# Patient Record
Sex: Male | Born: 1960 | Race: White | Hispanic: No
Health system: Southern US, Community
[De-identification: ages and names within clinical notes are randomized; demographics above are authoritative.]

## PROBLEM LIST (undated history)

## (undated) DIAGNOSIS — G4730 Sleep apnea, unspecified: Secondary | ICD-10-CM

## (undated) DIAGNOSIS — E785 Hyperlipidemia, unspecified: Secondary | ICD-10-CM

## (undated) DIAGNOSIS — I10 Essential (primary) hypertension: Secondary | ICD-10-CM

## (undated) DIAGNOSIS — Z9109 Other allergy status, other than to drugs and biological substances: Secondary | ICD-10-CM

---

## 2005-01-07 ENCOUNTER — Observation Stay: Payer: Self-pay | Admitting: Internal Medicine

## 2006-05-12 ENCOUNTER — Ambulatory Visit: Payer: Self-pay | Admitting: Internal Medicine

## 2007-10-17 IMAGING — CR DG CHEST 1V PORT
1 series · 1 of 1 positions shown · non-contrast
Comparison: none

REASON FOR EXAM: Difficulty breathing.  [HOSPITAL]
COMMENTS:  LMP: (Male)

PROCEDURE:     DXR - DXR PORTABLE CHEST SINGLE VIEW  - January 06, 2005 [DATE]
RESULT:     AP view of the chest shows the lung fields to be clear.  No
pneumonia, pneumothorax, or pleural effusion is seen.  The heart size is
normal.

[view not recorded]
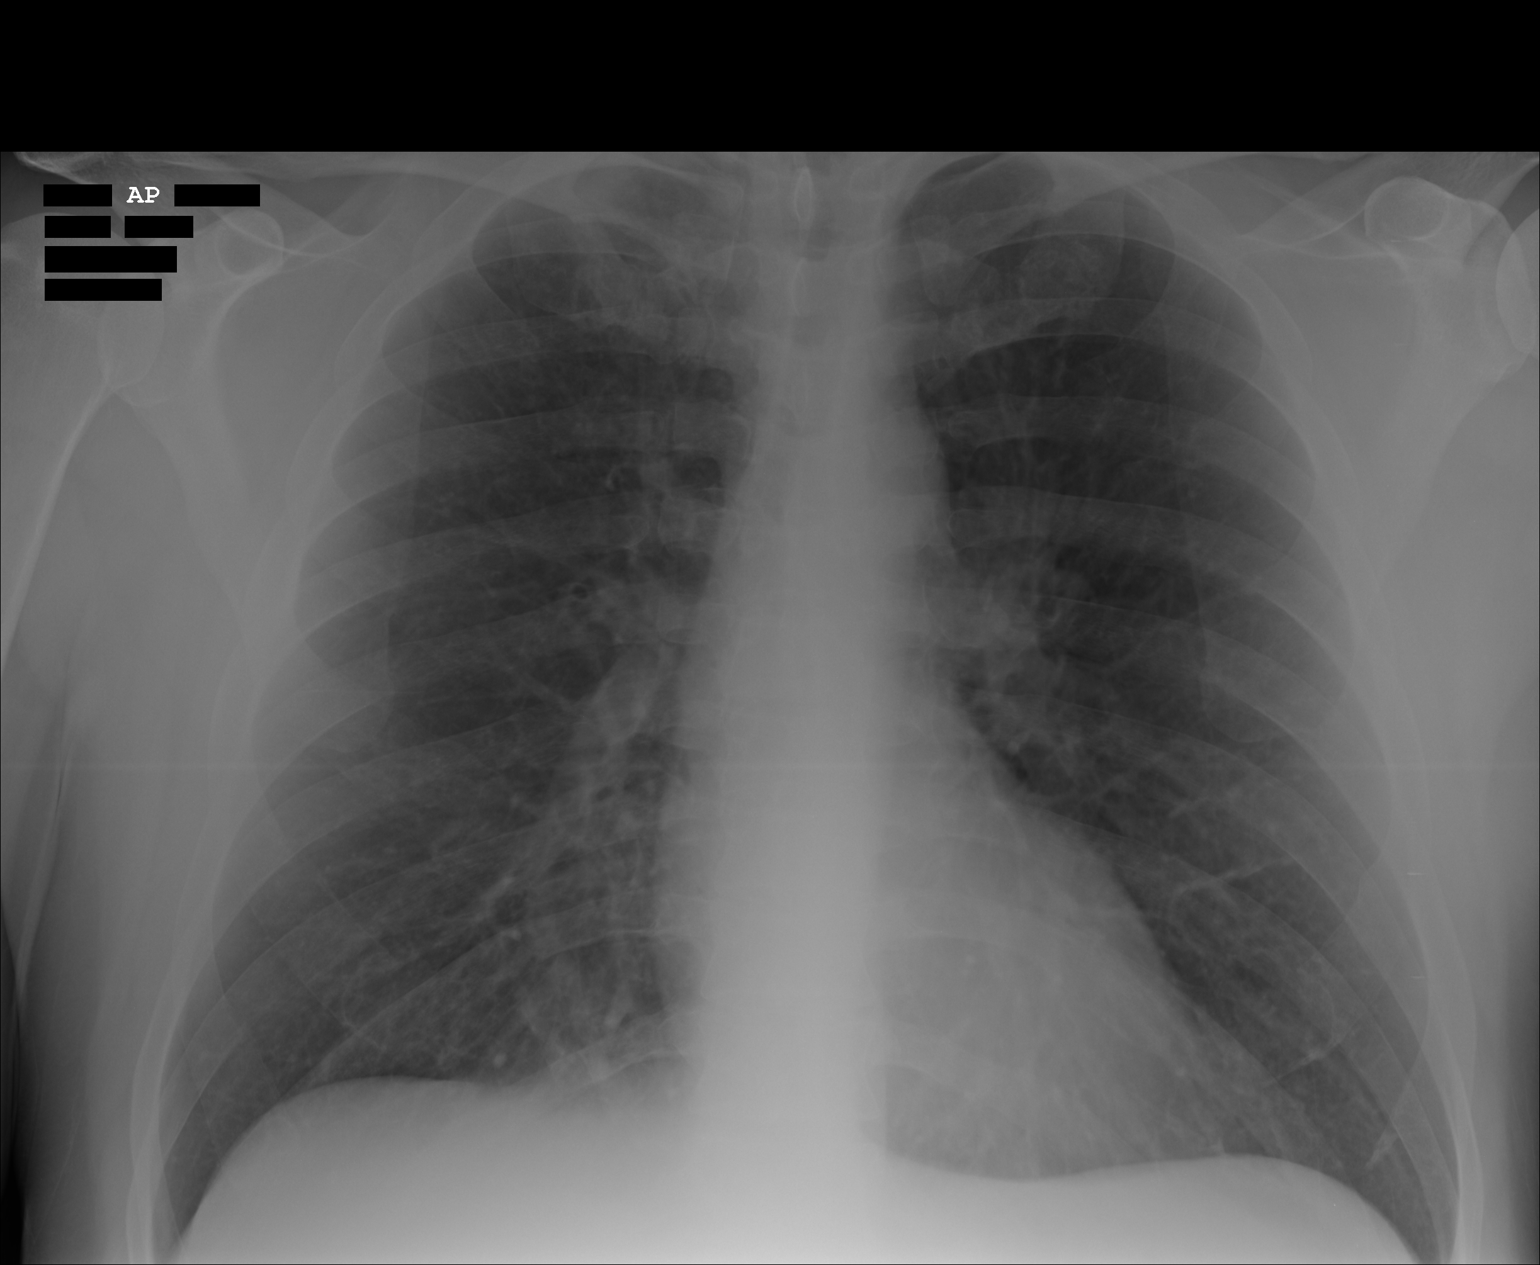

[1 of 1 positions shown; findings below may reference images not displayed]

IMPRESSION: No acute changes are identified.

## 2017-02-20 DIAGNOSIS — D239 Other benign neoplasm of skin, unspecified: Secondary | ICD-10-CM

## 2017-02-20 HISTORY — DX: Other benign neoplasm of skin, unspecified: D23.9

## 2018-02-10 ENCOUNTER — Encounter: Payer: Self-pay | Admitting: *Deleted

## 2018-03-10 ENCOUNTER — Telehealth: Payer: Self-pay | Admitting: Gastroenterology

## 2018-03-10 NOTE — Telephone Encounter (Signed)
Pt left vm for [name] to schedule

## 2018-03-11 NOTE — Telephone Encounter (Signed)
LVM returning patients call.  Asking him to call me back to schedule his colonoscopy.  Thanks [employer]

## 2019-07-19 ENCOUNTER — Ambulatory Visit: Payer: PRIVATE HEALTH INSURANCE | Admitting: Dermatology

## 2020-02-24 ENCOUNTER — Ambulatory Visit (INDEPENDENT_AMBULATORY_CARE_PROVIDER_SITE_OTHER): Payer: PRIVATE HEALTH INSURANCE | Admitting: Dermatology

## 2020-02-24 ENCOUNTER — Other Ambulatory Visit: Payer: Self-pay

## 2020-02-24 DIAGNOSIS — C439 Malignant melanoma of skin, unspecified: Secondary | ICD-10-CM

## 2020-02-24 DIAGNOSIS — D0322 Melanoma in situ of left ear and external auricular canal: Secondary | ICD-10-CM

## 2020-02-24 DIAGNOSIS — L821 Other seborrheic keratosis: Secondary | ICD-10-CM | POA: Diagnosis not present

## 2020-02-24 DIAGNOSIS — L820 Inflamed seborrheic keratosis: Secondary | ICD-10-CM | POA: Diagnosis not present

## 2020-02-24 DIAGNOSIS — D485 Neoplasm of uncertain behavior of skin: Secondary | ICD-10-CM

## 2020-02-24 HISTORY — DX: Malignant melanoma of skin, unspecified: C43.9

## 2020-02-24 MED ORDER — GENTAMICIN SULFATE 0.1 % EX OINT: 1.0000 "application " | TOPICAL_OINTMENT | Freq: Every day | CUTANEOUS | 0 refills | Status: AC

## 2020-02-24 NOTE — Patient Instructions (Addendum)
Wound Care Instructions  1. Cleanse wound gently with soap and water once a day then pat dry with clean gauze. Apply a thing coat of Petrolatum (petroleum jelly, "Vaseline") over the wound (unless you have an allergy to this). We recommend that you use a new, sterile tube of Vaseline. Do not pick or remove scabs. Do not remove the yellow or white "healing tissue" from the base of the wound.  2. Cover the wound with fresh, clean, nonstick gauze and secure with paper tape. You may use Band-Aids in place of gauze and tape if the would is small enough, but would recommend trimming much of the tape off as there is often too much. Sometimes Band-Aids can irritate the skin.  3. You should call the office for your biopsy report after 1 week if you have not already been contacted.  4. If you experience any problems, such as abnormal amounts of bleeding, swelling, significant bruising, significant pain, or evidence of infection, please call the office immediately.  Cryotherapy Aftercare  . Wash gently with soap and water everyday.   . Apply Vaseline and Band-Aid daily until healed.  Prior to procedure, discussed risks of blister formation, small wound, skin dyspigmentation, or rare scar following cryotherapy.   

## 2020-02-24 NOTE — Progress Notes (Signed)
   Follow-Up Visit   Subjective  Justin Gordon is a 60 y.o. male who presents for the following: Skin Problem (Patient here today for spot behind left ear. He is unsure how long it's been there, girlfriend noticed. No symptoms. No personal or family history of skin cancer. ).  Patient does have a history of dysplastic nevus. He also has a few spots at left thigh he would like checked. Some have been catching on clothing and getting irritated.  The following portions of the chart were reviewed this encounter and updated as appropriate:   Allergies  Meds  Problems  Med Hx  Surg Hx  Fam Hx      Review of Systems:  No other skin or systemic complaints except as noted in HPI or Assessment and Plan.  Objective  Well appearing patient in no apparent distress; mood and affect are within normal limits.  A focused examination was performed including left ear, left thigh, right thigh. Relevant physical exam findings are noted in the Assessment and Plan.  Objective  Left Posterior Ear: 0.8cm medium brown papule with central dark globules     Objective  Left inner Thigh x 5, right inner thigh x 1 (6): Erythematous keratotic or waxy stuck-on papule or plaque.    Assessment & Plan  Neoplasm of uncertain behavior of skin Left Posterior Ear  Epidermal / dermal shaving  Lesion diameter (cm):  0.8 Informed consent: discussed and consent obtained   Timeout: patient name, date of birth, surgical site, and procedure verified   Patient was prepped and draped in usual sterile fashion: area prepped with isopropyl alcohol. Anesthesia: the lesion was anesthetized in a standard fashion   Anesthetic:  1% lidocaine w/ epinephrine 1-100,000 buffered w/ 8.4% NaHCO3 Instrument used: flexible razor blade   Hemostasis achieved with: aluminum chloride   Outcome: patient tolerated procedure well   Post-procedure details: wound care instructions given   Additional details:  Mupirocin and a bandage  applied  Specimen 1 - Surgical pathology Differential Diagnosis: r/o Atypia  Check Margins: No 0.8cm medium brown papules with central dark globules  Start gentamicin daily  Ordered Medications: gentamicin ointment (GARAMYCIN) 0.1 %  Inflamed seborrheic keratosis (6) Left inner Thigh x 5, right inner thigh x 1  Prior to procedure, discussed risks of blister formation, small wound, skin dyspigmentation, or rare scar following cryotherapy.    Destruction of lesion - Left inner Thigh x 5, right inner thigh x 1 Complexity: simple   Destruction method: cryotherapy   Informed consent: discussed and consent obtained   Lesion destroyed using liquid nitrogen: Yes   Cryotherapy cycles:  2 Outcome: patient tolerated procedure well with no complications   Post-procedure details: wound care instructions given    Seborrheic Keratoses - Stuck-on, waxy, tan-brown papules and plaques  - Discussed benign etiology and prognosis. - Observe - Call for any changes  Return for TBSE.  Graciella Belton, RMA, am acting as scribe for Forest Gleason, MD .  Documentation: I have reviewed the above documentation for accuracy and completeness, and I agree with the above.  Forest Gleason, MD

## 2020-02-29 ENCOUNTER — Other Ambulatory Visit: Payer: Self-pay

## 2020-02-29 DIAGNOSIS — D038 Melanoma in situ of other sites: Secondary | ICD-10-CM

## 2020-02-29 NOTE — Progress Notes (Signed)
Skin , left posterior ear MELANOMA IN SITU, CLOSE TO MARGIN --> Mohs surgery given location  No answer, left voicemail 02/29/2020 at 1:20 PM.   If patient calls, ok to give him result and recommendation for Mohs surgery at [facility] or the [facility] in [place] and refer for Mohs if he is agreeable. Also he needs to keep appointment for FBSE in April. If you let me know (Dr. Laurence Ferrari) when he calls, I will call him to be sure all his questions are answered.

## 2020-02-29 NOTE — Progress Notes (Signed)
Skin , left posterior ear MELANOMA IN SITU, CLOSE TO MARGIN --> Mohs surgery given location  Spoke with patient 2 PM 02/29/2020. Will refer to the [facility] for Mohs  Mas can you please place referral and also schedule for sooner FBSE if possible? Thank you!

## 2020-05-31 ENCOUNTER — Other Ambulatory Visit: Payer: Self-pay

## 2020-05-31 ENCOUNTER — Encounter: Payer: Self-pay | Admitting: Dermatology

## 2020-05-31 ENCOUNTER — Ambulatory Visit (INDEPENDENT_AMBULATORY_CARE_PROVIDER_SITE_OTHER): Payer: PRIVATE HEALTH INSURANCE | Admitting: Dermatology

## 2020-05-31 DIAGNOSIS — L814 Other melanin hyperpigmentation: Secondary | ICD-10-CM

## 2020-05-31 DIAGNOSIS — D2361 Other benign neoplasm of skin of right upper limb, including shoulder: Secondary | ICD-10-CM

## 2020-05-31 DIAGNOSIS — Z86006 Personal history of melanoma in-situ: Secondary | ICD-10-CM

## 2020-05-31 DIAGNOSIS — D235 Other benign neoplasm of skin of trunk: Secondary | ICD-10-CM

## 2020-05-31 DIAGNOSIS — D239 Other benign neoplasm of skin, unspecified: Secondary | ICD-10-CM

## 2020-05-31 DIAGNOSIS — D229 Melanocytic nevi, unspecified: Secondary | ICD-10-CM

## 2020-05-31 DIAGNOSIS — Z1283 Encounter for screening for malignant neoplasm of skin: Secondary | ICD-10-CM

## 2020-05-31 DIAGNOSIS — D2372 Other benign neoplasm of skin of left lower limb, including hip: Secondary | ICD-10-CM | POA: Diagnosis not present

## 2020-05-31 DIAGNOSIS — L821 Other seborrheic keratosis: Secondary | ICD-10-CM

## 2020-05-31 DIAGNOSIS — L719 Rosacea, unspecified: Secondary | ICD-10-CM

## 2020-05-31 DIAGNOSIS — L578 Other skin changes due to chronic exposure to nonionizing radiation: Secondary | ICD-10-CM

## 2020-05-31 DIAGNOSIS — Z86018 Personal history of other benign neoplasm: Secondary | ICD-10-CM

## 2020-05-31 DIAGNOSIS — D1800 Hemangioma unspecified site: Secondary | ICD-10-CM

## 2020-05-31 NOTE — Progress Notes (Signed)
Follow-Up Visit   Subjective  Justin Gordon is a 60 y.o. male who presents for the following: FBSE (Patient here for full body skin exam and skin cancer screening. Patient has a hx of Mmis and dysplastic nevi. Nothing new or changing that patient is aware of. ).   The following portions of the chart were reviewed this encounter and updated as appropriate:   Allergies  Meds  Problems  Med Hx  Surg Hx  Fam Hx      Review of Systems:  No other skin or systemic complaints except as noted in HPI or Assessment and Plan.  Objective  Well appearing patient in no apparent distress; mood and affect are within normal limits.  A full examination was performed including scalp, head, eyes, ears, nose, lips, neck, chest, axillae, abdomen, back, buttocks, bilateral upper extremities, bilateral lower extremities, hands, feet, fingers, toes, fingernails, and toenails. All findings within normal limits unless otherwise noted below.  Objective  Right elbow, left pretibia, right upper back: Firm pink/brown papulenodule with dimple sign.   Objective  Face: Mid face erythema   Assessment & Plan  Dermatofibroma Right elbow, left pretibia, right upper back  Benign-appearing.  Observation.  Call clinic for new or changing lesions.  Recommend daily use of broad spectrum spf 30+ sunscreen to sun-exposed areas.    Rosacea Face  Not bothersome for patient. Patient deferred treatment at this time.  No irritation or grittiness of eyes.   Rosacea is a chronic progressive skin condition usually affecting the face of adults, causing redness and/or acne bumps. It is treatable but not curable. It sometimes affects the eyes (ocular rosacea) as well. It may respond to topical and/or systemic medication and can flare with stress, sun exposure, alcohol, exercise and some foods.  Daily application of broad spectrum spf 30+ sunscreen to face is recommended to reduce flares.    Lentigines - Scattered tan  macules - Due to sun exposure - Benign-appering, observe - Recommend daily broad spectrum sunscreen SPF 30+ to sun-exposed areas, reapply every 2 hours as needed. - Call for any changes  Seborrheic Keratoses - Stuck-on, waxy, tan-brown papules and/or plaques  - Benign-appearing - Discussed benign etiology and prognosis. - Observe - Call for any changes  Melanocytic Nevi - Tan-brown and/or pink-flesh-colored symmetric macules and papules - Benign appearing on exam today - Observation - Call clinic for new or changing moles - Recommend daily use of broad spectrum spf 30+ sunscreen to sun-exposed areas.   Hemangiomas - Red papules - Discussed benign nature - Observe - Call for any changes  Actinic Damage - Chronic condition, secondary to cumulative UV/sun exposure - diffuse scaly erythematous macules with underlying dyspigmentation - Recommend daily broad spectrum sunscreen SPF 30+ to sun-exposed areas, reapply every 2 hours as needed.  - Staying in the shade or wearing long sleeves, sun glasses (UVA+UVB protection) and wide brim hats (4-inch brim around the entire circumference of the hat) are also recommended for sun protection.  - Call for new or changing lesions.  Skin cancer screening performed today.  History of Melanoma in Situ - No evidence of recurrence today at left posterior ear, treated with Moh's 03/21/20 - Recommend regular full body skin exams - Recommend daily broad spectrum sunscreen SPF 30+ to sun-exposed areas, reapply every 2 hours as needed.  - Call if any new or changing lesions are noted between office visits  History of Dysplastic Nevi - No evidence of recurrence today at left medial pectoral,  moderate - Recommend regular full body skin exams - Recommend daily broad spectrum sunscreen SPF 30+ to sun-exposed areas, reapply every 2 hours as needed.  - Call if any new or changing lesions are noted between office visits  Return in about 3 months (around  08/30/2020) for TBSE.  Graciella Belton, RMA, am acting as scribe for Forest Gleason, MD .  Documentation: I have reviewed the above documentation for accuracy and completeness, and I agree with the above.  Forest Gleason, MD

## 2020-05-31 NOTE — Patient Instructions (Addendum)
Melanoma ABCDEs  Melanoma is the most dangerous type of skin cancer, and is the leading cause of death from skin disease.  You are more likely to develop melanoma if you:  Have light-colored skin, light-colored eyes, or red or blond hair  Spend a lot of time in the sun  Tan regularly, either outdoors or in a tanning bed  Have had blistering sunburns, especially during childhood  Have a close family member who has had a melanoma  Have atypical moles or large birthmarks  Early detection of melanoma is key since treatment is typically straightforward and cure rates are extremely high if we catch it early.   The first sign of melanoma is often a change in a mole or a new dark spot.  The ABCDE system is a way of remembering the signs of melanoma.  A for asymmetry:  The two halves do not match. B for border:  The edges of the growth are irregular. C for color:  A mixture of colors are present instead of an even brown color. D for diameter:  Melanomas are usually (but not always) greater than 75mm - the size of a pencil eraser. E for evolution:  The spot keeps changing in size, shape, and color.  Please check your skin once per month between visits. You can use a small mirror in front and a large mirror behind you to keep an eye on the back side or your body.   If you see any new or changing lesions before your next follow-up, please call to schedule a visit.  Please continue daily skin protection including broad spectrum sunscreen SPF 30+ to sun-exposed areas, reapplying every 2 hours as needed when you're outdoors.   Rosacea  What is rosacea? Rosacea (say: ro-zay-sha) is a common skin disease that usually begins as a trend of flushing or blushing easily.  As rosacea progresses, a persistent redness in the center of the face will develop and may gradually spread beyond the nose and cheeks to the forehead and chin.  In some cases, the ears, chest, and back could be affected.  Rosacea may  appear as tiny blood vessels or small red bumps that occur in crops.  Frequently they can contain pus, and are called "pustules".  If the bumps do not contain pus, they are referred to as "papules".  Rarely, in prolonged, untreated cases of rosacea, the oil glands of the nose and cheeks may become permanently enlarged.  This is called rhinophyma, and is seen more frequently in men.  Signs and Risks In its beginning stages, rosacea tends to come and go, which makes it difficult to recognize.  It can start as intermittent flushing of the face.  Eventually, blood vessels may become permanently visible.  Pustules and papules can appear, but can be mistaken for adult acne.  People of all races, ages, genders and ethnic groups are at risk of developing rosacea.  However, it is more common in women (especially around menopause) and adults with fair skin between the ages of 82 and 98.  Treatment Dermatologists typically recommend a combination of treatments to effectively manage rosacea.  Treatment can improve symptoms and may stop the progression of the rosacea.  Treatment may involve both topical and oral medications.  The tetracycline antibiotics are often used for their anti-inflammatory effect; however, because of the possibility of developing antibiotic resistance, they should not be used long term at full dose.  For dilated blood vessels the options include electrodessication (uses electric current  through a small needle), laser treatment, and cosmetics to hide the redness.   With all forms of treatment, improvement is a slow process, and patients may not see any results for the first 3-4 weeks.  It is very important to avoid the sun and other triggers.  Patients must wear sunscreen daily.  Skin Care Instructions: 1. Cleanse the skin with a mild soap such as CeraVe cleanser, Cetaphil cleanser, or Dove soap once or twice daily as needed. 2. Moisturize with Eucerin Redness Relief Daily Perfecting Lotion (has  a subtle green tint), CeraVe Moisturizing Cream, or Oil of Olay Daily Moisturizer with sunscreen every morning and/or night as recommended. 3. Makeup should be "non-comedogenic" (won't clog pores) and be labeled "for sensitive skin". Good choices for cosmetics are: Neutrogena, Almay, and Physician's Formula.  Any product with a green tint tends to offset a red complexion. 4. If your eyes are dry and irritated, use artificial tears 2-3 times per day and cleanse the eyelids daily with baby shampoo.  Have your eyes examined at least every 2 years.  Be sure to tell your eye doctor that you have rosacea. 5. Alcoholic beverages tend to cause flushing of the skin, and may make rosacea worse. 6. Always wear sunscreen, protect your skin from extreme hot and cold temperatures, and avoid spicy foods, hot drinks, and mechanical irritation such as rubbing, scrubbing, or massaging the face.  Avoid harsh skin cleansers, cleansing masks, astringents, and exfoliation. If a particular product burns or makes your face feel tight, then it is likely to flare your rosacea. 7. If you are having difficulty finding a sunscreen that you can tolerate, you may try switching to a chemical-free sunscreen.  These are ones whose active ingredient is zinc oxide or titanium dioxide only.  They should also be fragrance free, non-comedogenic, and labeled for sensitive skin. 8. Rosacea triggers may vary from person to person.  There are a variety of foods that have been reported to trigger rosacea.  Some patients find that keeping a diary of what they were doing when they flared helps them avoid triggers.  Recommend ketotifen allergy eye drops.  Recommend taking Heliocare sun protection supplement daily in sunny weather for additional sun protection. For maximum protection on the sunniest days, you can take up to 2 capsules of regular Heliocare OR take 1 capsule of Heliocare Ultra. For prolonged exposure (such as a full day in the sun), you  can repeat your dose of the supplement 4 hours after your first dose. Heliocare can be purchased at [facility] or at [email or web address].

## 2020-08-30 ENCOUNTER — Ambulatory Visit: Payer: PRIVATE HEALTH INSURANCE | Admitting: Dermatology

## 2022-02-04 HISTORY — PX: MENISCUS REPAIR: SHX5179

## 2023-05-06 ENCOUNTER — Encounter: Payer: Self-pay | Admitting: Gastroenterology

## 2023-05-12 ENCOUNTER — Encounter: Payer: Self-pay | Admitting: Gastroenterology

## 2023-05-16 ENCOUNTER — Encounter
Admission: RE | Disposition: A | Discharge status: Home / Self Care | Payer: Self-pay | Source: Home / Self Care | Attending: Gastroenterology

## 2023-05-16 ENCOUNTER — Other Ambulatory Visit: Payer: Self-pay

## 2023-05-16 ENCOUNTER — Ambulatory Visit
Admission: RE | Admit: 2023-05-16 | Discharge: 2023-05-16 | Disposition: A | Discharge status: Home / Self Care | Payer: PRIVATE HEALTH INSURANCE | Attending: Gastroenterology | Admitting: Gastroenterology

## 2023-05-16 ENCOUNTER — Encounter: Payer: Self-pay | Admitting: Gastroenterology

## 2023-05-16 ENCOUNTER — Ambulatory Visit: Admitting: Anesthesiology

## 2023-05-16 DIAGNOSIS — G4730 Sleep apnea, unspecified: Secondary | ICD-10-CM | POA: Insufficient documentation

## 2023-05-16 DIAGNOSIS — I10 Essential (primary) hypertension: Secondary | ICD-10-CM | POA: Insufficient documentation

## 2023-05-16 DIAGNOSIS — K635 Polyp of colon: Secondary | ICD-10-CM | POA: Diagnosis not present

## 2023-05-16 DIAGNOSIS — K5730 Diverticulosis of large intestine without perforation or abscess without bleeding: Secondary | ICD-10-CM | POA: Insufficient documentation

## 2023-05-16 DIAGNOSIS — Z8379 Family history of other diseases of the digestive system: Secondary | ICD-10-CM | POA: Diagnosis not present

## 2023-05-16 DIAGNOSIS — Z1211 Encounter for screening for malignant neoplasm of colon: Secondary | ICD-10-CM | POA: Insufficient documentation

## 2023-05-16 DIAGNOSIS — E66813 Obesity, class 3: Secondary | ICD-10-CM | POA: Diagnosis not present

## 2023-05-16 DIAGNOSIS — Z6841 Body Mass Index (BMI) 40.0 and over, adult: Secondary | ICD-10-CM | POA: Diagnosis not present

## 2023-05-16 HISTORY — DX: Essential (primary) hypertension: I10

## 2023-05-16 HISTORY — DX: Sleep apnea, unspecified: G47.30

## 2023-05-16 HISTORY — DX: Other allergy status, other than to drugs and biological substances: Z91.09

## 2023-05-16 HISTORY — DX: Hyperlipidemia, unspecified: E78.5

## 2023-05-16 HISTORY — PX: COLONOSCOPY WITH PROPOFOL: SHX5780

## 2023-05-16 SURGERY — COLONOSCOPY WITH PROPOFOL
Anesthesia: General

## 2023-05-16 MED ORDER — PROPOFOL 500 MG/50ML IV EMUL
INTRAVENOUS | Status: DC | PRN
  Administered 2023-05-16: 100 ug/kg/min via INTRAVENOUS

## 2023-05-16 MED ORDER — SODIUM CHLORIDE 0.9 % IV SOLN: INTRAVENOUS | Status: DC

## 2023-05-16 MED ORDER — PROPOFOL 10 MG/ML IV BOLUS
INTRAVENOUS | Status: DC | PRN
  Administered 2023-05-16: 100 mg via INTRAVENOUS

## 2023-05-16 MED ORDER — PROPOFOL 10 MG/ML IV BOLUS
INTRAVENOUS | Status: AC
  Filled 2023-05-16: qty 40

## 2023-05-16 NOTE — Interval H&P Note (Signed)
 History and Physical Interval Note: Preprocedure H&P from 05/16/23  was reviewed and there was no interval change after seeing and examining the patient.  Written consent was obtained from the patient after discussion of risks, benefits, and alternatives. Patient has consented to proceed with Colonoscopy with possible intervention   05/16/2023 10:30 AM  Justin Gordon  has presented today for surgery, with the diagnosis of Z12.11 (ICD-10-CM) - Colon cancer screening.  The various methods of treatment have been discussed with the patient and family. After consideration of risks, benefits and other options for treatment, the patient has consented to  Procedure(s): COLONOSCOPY WITH PROPOFOL (N/A) as a surgical intervention.  The patient's history has been reviewed, patient examined, no change in status, stable for surgery.  I have reviewed the patient's chart and labs.  Questions were answered to the patient's satisfaction.     Jaynie Collins

## 2023-05-16 NOTE — Anesthesia Postprocedure Evaluation (Signed)
 Anesthesia Post Note  Patient: Justin Gordon  Procedure(s) Performed: COLONOSCOPY WITH PROPOFOL  Patient location during evaluation: PACU Anesthesia Type: General Level of consciousness: awake and alert, oriented and patient cooperative Pain management: pain level controlled Vital Signs Assessment: post-procedure vital signs reviewed and stable Respiratory status: spontaneous breathing, nonlabored ventilation and respiratory function stable Cardiovascular status: blood pressure returned to baseline and stable Postop Assessment: adequate PO intake Anesthetic complications: no   No notable events documented.   Last Vitals:  Vitals:   05/16/23 1109 05/16/23 1119  BP: (!) 116/94 (!) 146/89  Pulse: 75 69  Resp: 11 13  Temp:    SpO2: 100% 98%    Last Pain:  Vitals:   05/16/23 1119  TempSrc:   PainSc: 0-No pain                 Reed Breech

## 2023-05-16 NOTE — Op Note (Signed)
 [facility] Gastroenterology Patient Name: Justin Gordon Procedure Date: 05/16/2023 10:32 AM MRN: [id number] Account #: [id number] Date of Birth: [birth year] Admit Type: Outpatient Age: 63 Room: [facility] ENDO ROOM 1 Gender: Male Note Status: Finalized Instrument Name: Colonoscope [id number] Procedure:             Colonoscopy Indications:           Screening for colorectal malignant neoplasm Providers:             Trenda Moots, DO Referring MD:          Duane Lope. Judithann Sheen, MD (Referring MD) Medicines:             Monitored Anesthesia Care Complications:         No immediate complications. Estimated blood loss:                         Minimal. Procedure:             Pre-Anesthesia Assessment:                        - Prior to the procedure, a History and Physical was                         performed, and patient medications and allergies were                         reviewed. The patient is competent. The risks and                         benefits of the procedure and the sedation options and                         risks were discussed with the patient. All questions                         were answered and informed consent was obtained.                         Patient identification and proposed procedure were                         verified by the physician, the nurse, the anesthetist                         and the technician in the endoscopy suite. Mental                         Status Examination: alert and oriented. Airway                         Examination: normal oropharyngeal airway and neck                         mobility. Respiratory Examination: clear to                         auscultation. CV Examination: RRR, no murmurs, no S3  or S4. Prophylactic Antibiotics: The patient does not                         require prophylactic antibiotics. Prior                         Anticoagulants: The patient has taken no anticoagulant                          or antiplatelet agents. ASA Grade Assessment: III - A                         patient with severe systemic disease. After reviewing                         the risks and benefits, the patient was deemed in                         satisfactory condition to undergo the procedure. The                         anesthesia plan was to use monitored anesthesia care                         (MAC). Immediately prior to administration of                         medications, the patient was re-assessed for adequacy                         to receive sedatives. The heart rate, respiratory                         rate, oxygen saturations, blood pressure, adequacy of                         pulmonary ventilation, and response to care were                         monitored throughout the procedure. The physical                         status of the patient was re-assessed after the                         procedure.                        After obtaining informed consent, the colonoscope was                         passed under direct vision. Throughout the procedure,                         the patient's blood pressure, pulse, and oxygen                         saturations were monitored continuously. The  Colonoscope was introduced through the anus and                         advanced to the the terminal ileum, with                         identification of the appendiceal orifice and IC                         valve. The colonoscopy was performed without                         difficulty. The patient tolerated the procedure well.                         The quality of the bowel preparation was evaluated                         using the BBPS [facility] Bowel Preparation Scale) with                         scores of: Right Colon = 2 (minor amount of residual                         staining, small fragments of stool and/or opaque                          liquid, but mucosa seen well), Transverse Colon = 2                         (minor amount of residual staining, small fragments of                         stool and/or opaque liquid, but mucosa seen well) and                         Left Colon = 2 (minor amount of residual staining,                         small fragments of stool and/or opaque liquid, but                         mucosa seen well). The total BBPS score equals 6. The                         quality of the bowel preparation was good after                         lavage. The terminal ileum, ileocecal valve,                         appendiceal orifice, and rectum were photographed. Findings:      The perianal and digital rectal examinations were normal. Pertinent       negatives include normal sphincter tone.      The terminal ileum appeared normal. Estimated blood loss: none.      Retroflexion in the right colon was  performed.      A 1 to 2 mm polyp was found in the cecum. The polyp was sessile. The       polyp was removed with a jumbo cold forceps. Resection and retrieval       were complete. Estimated blood loss was minimal.      Multiple small-mouthed diverticula were found in the left colon.       Estimated blood loss: none.      A moderate amount of adherent stool was found in the entire colon,       interfering with visualization. Lavage of the area was performed using a       large amount, resulting in clearance with good visualization. Estimated       blood loss: none.      The exam was otherwise without abnormality on direct and retroflexion       views. Impression:            - The examined portion of the ileum was normal.                        - One 1 to 2 mm polyp in the cecum, removed with a                         jumbo cold forceps. Resected and retrieved.                        - Diverticulosis in the left colon.                        - Stool in the entire examined colon.                        - The  examination was otherwise normal on direct and                         retroflexion views. Recommendation:        - Patient has a contact number available for                         emergencies. The signs and symptoms of potential                         delayed complications were discussed with the patient.                         Return to normal activities tomorrow. Written                         discharge instructions were provided to the patient.                        - Discharge patient to home.                        - Resume previous diet.                        - Continue present medications.                        -  Await pathology results.                        - Repeat colonoscopy for surveillance based on                         pathology results.                        - Return to referring physician as previously                         scheduled.                        - The findings and recommendations were discussed with                         the patient. Procedure Code(s):     --- Professional ---                        [phone], Colonoscopy, flexible; with biopsy, single or                         multiple Diagnosis Code(s):     --- Professional ---                        Z12.11, Encounter for screening for malignant neoplasm                         of colon                        D12.0, Benign neoplasm of cecum                        K57.30, Diverticulosis of large intestine without                         perforation or abscess without bleeding CPT copyright 2022 [employer]. All rights reserved. The codes documented in this report are preliminary and upon coder review may  be revised to meet current compliance requirements. Attending Participation:      I personally performed the entire procedure. Elfredia Nevins, DO Jaynie Collins DO, DO 05/16/2023 10:58:42 AM This report has been signed electronically. Number of Addenda: 0 Note  Initiated On: 05/16/2023 10:32 AM Scope Withdrawal Time: 0 hours 11 minutes 47 seconds  Total Procedure Duration: 0 hours 14 minutes 31 seconds  Estimated Blood Loss:  Estimated blood loss was minimal.      [facility]

## 2023-05-16 NOTE — Anesthesia Preprocedure Evaluation (Addendum)
 Anesthesia Evaluation  Patient identified by MRN, date of birth, ID band Patient awake    Reviewed: Allergy & Precautions, NPO status , Patient's Chart, lab work & pertinent test results  History of Anesthesia Complications Negative for: history of anesthetic complications  Airway Mallampati: IV   Neck ROM: Full    Dental  (+) Missing   Pulmonary sleep apnea , former smoker (remote)   Pulmonary exam normal breath sounds clear to auscultation       Cardiovascular hypertension, Normal cardiovascular exam Rhythm:Regular Rate:Normal     Neuro/Psych negative neurological ROS     GI/Hepatic negative GI ROS,,,  Endo/Other    Class 3 obesity  Renal/GU negative Renal ROS     Musculoskeletal   Abdominal   Peds  Hematology negative hematology ROS (+)   Anesthesia Other Findings   Reproductive/Obstetrics                             Anesthesia Physical Anesthesia Plan  ASA: 3  Anesthesia Plan: General   Post-op Pain Management:    Induction: Intravenous  PONV Risk Score and Plan: 2 and Propofol infusion, TIVA and Treatment may vary due to age or medical condition  Airway Management Planned: Natural Airway  Additional Equipment:   Intra-op Plan:   Post-operative Plan:   Informed Consent: I have reviewed the patients History and Physical, chart, labs and discussed the procedure including the risks, benefits and alternatives for the proposed anesthesia with the patient or authorized representative who has indicated his/her understanding and acceptance.       Plan Discussed with: CRNA  Anesthesia Plan Comments: (LMA/GETA backup discussed.  Patient consented for risks of anesthesia including but not limited to:  - adverse reactions to medications - damage to eyes, teeth, lips or other oral mucosa - nerve damage due to positioning  - sore throat or hoarseness - damage to heart,  brain, nerves, lungs, other parts of body or loss of life  Informed patient about role of CRNA in peri- and intra-operative care.  Patient voiced understanding.)       Anesthesia Quick Evaluation

## 2023-05-16 NOTE — H&P (Signed)
 Pre-Procedure H&P   Patient ID: Justin Gordon is a 63 y.o. male.  Gastroenterology Provider: Jaynie Collins, DO  Referring Provider: Dr. Judithann Sheen PCP: Marguarite Arbour, MD  Date: 05/16/2023  HPI Justin Gordon is a 63 y.o. male who presents today for Colonoscopy for Colorectal cancer screening .  Regular bowel movements 1-2 times a day without melena or hematochezia.  Hemoglobin 15.8 MCV 93.5 platelets 290,000 creatinine 0.9 Father with a history of Crohn's disease.  No history of colon cancer or colon polyps   Past Medical History:  Diagnosis Date   Dysplastic nevus 02/20/2017   L med pectoral/moderate   Environmental allergies    Hyperlipidemia    Hypertension    Melanoma (HCC) 02/24/2020   MMIS L post ear - MOHS 03/21/20   Sleep apnea    wears CPAP    Past Surgical History:  Procedure Laterality Date   MENISCUS REPAIR Right 2024    Family History Father- crohns No other h/o GI disease or malignancy  Review of Systems  Constitutional:  Negative for activity change, appetite change, chills, diaphoresis, fatigue, fever and unexpected weight change.  HENT:  Negative for trouble swallowing and voice change.   Respiratory:  Negative for shortness of breath and wheezing.   Cardiovascular:  Negative for chest pain, palpitations and leg swelling.  Gastrointestinal:  Negative for abdominal distention, abdominal pain, anal bleeding, blood in stool, constipation, diarrhea, nausea and vomiting.  Musculoskeletal:  Negative for arthralgias and myalgias.  Skin:  Negative for color change and pallor.  Neurological:  Negative for dizziness, syncope and weakness.  Psychiatric/Behavioral:  Negative for confusion. The patient is not nervous/anxious.   All other systems reviewed and are negative.    Medications No current facility-administered medications on file prior to encounter.   Current Outpatient Medications on File Prior to Encounter  Medication Sig  Dispense Refill   carvedilol (COREG) 25 MG tablet Take 25 mg by mouth 2 (two) times daily.     clonazePAM (KLONOPIN) 0.5 MG tablet Take by mouth.     hydrALAZINE (APRESOLINE) 50 MG tablet Take 50 mg by mouth 2 (two) times daily.     losartan-hydrochlorothiazide (HYZAAR) 100-25 MG tablet Take 1 tablet by mouth daily.     gentamicin ointment (GARAMYCIN) 0.1 % Apply 1 application topically daily. And cover with band aid (Patient not taking: Reported on 05/12/2023) 15 g 0    Pertinent medications related to GI and procedure were reviewed by me with the patient prior to the procedure   Current Facility-Administered Medications:    0.9 %  sodium chloride infusion, , Intravenous, Continuous, Jaynie Collins, DO, Last Rate: 20 mL/hr at 05/16/23 0950, New Bag at 05/16/23 0950  sodium chloride 20 mL/hr at 05/16/23 7846       Allergies  Allergen Reactions   Amlodipine Swelling   Penicillin V     Other reaction(s): Unknown   Allergies were reviewed by me prior to the procedure  Objective   Body mass index is 40.25 kg/m. Vitals:   05/16/23 0945  BP: (!) 170/98  Pulse: 87  Resp: 18  Temp: 97.8 F (36.6 C)  TempSrc: Temporal  Weight: (!) 146.1 kg  Height: 6\' 3"  (1.905 m)     Physical Exam Vitals and nursing note reviewed.  Constitutional:      General: He is not in acute distress.    Appearance: Normal appearance. He is obese. He is not ill-appearing, toxic-appearing or diaphoretic.  HENT:  Head: Normocephalic and atraumatic.     Nose: Nose normal.     Mouth/Throat:     Mouth: Mucous membranes are moist.     Pharynx: Oropharynx is clear.  Eyes:     General: No scleral icterus.    Extraocular Movements: Extraocular movements intact.  Cardiovascular:     Rate and Rhythm: Normal rate and regular rhythm.     Heart sounds: Normal heart sounds. No murmur heard.    No friction rub. No gallop.  Pulmonary:     Effort: Pulmonary effort is normal. No respiratory distress.      Breath sounds: Normal breath sounds. No wheezing, rhonchi or rales.  Abdominal:     General: Bowel sounds are normal. There is no distension.     Palpations: Abdomen is soft.     Tenderness: There is no abdominal tenderness. There is no guarding or rebound.  Musculoskeletal:     Cervical back: Neck supple.     Right lower leg: No edema.     Left lower leg: No edema.  Skin:    General: Skin is warm and dry.     Coloration: Skin is not jaundiced or pale.  Neurological:     General: No focal deficit present.     Mental Status: He is alert and oriented to person, place, and time. Mental status is at baseline.  Psychiatric:        Mood and Affect: Mood normal.        Behavior: Behavior normal.        Thought Content: Thought content normal.        Judgment: Judgment normal.      Assessment:  Mr. Justin Gordon is a 63 y.o. male  who presents today for Colonoscopy for Colorectal cancer screening .  Plan:  Colonoscopy with possible intervention today  Colonoscopy with possible biopsy, control of bleeding, polypectomy, and interventions as necessary has been discussed with the patient/patient representative. Informed consent was obtained from the patient/patient representative after explaining the indication, nature, and risks of the procedure including but not limited to death, bleeding, perforation, missed neoplasm/lesions, cardiorespiratory compromise, and reaction to medications. Opportunity for questions was given and appropriate answers were provided. Patient/patient representative has verbalized understanding is amenable to undergoing the procedure.   Jaynie Collins, DO  [facility] Gastroenterology  Portions of the record may have been created with voice recognition software. Occasional wrong-word or 'sound-a-like' substitutions may have occurred due to the inherent limitations of voice recognition software.  Read the chart carefully and recognize, using context, where  substitutions may have occurred.

## 2023-05-16 NOTE — Transfer of Care (Signed)
 Immediate Anesthesia Transfer of Care Note  Patient: Justin Gordon  Procedure(s) Performed: COLONOSCOPY WITH PROPOFOL  Patient Location: PACU  Anesthesia Type:MAC  Level of Consciousness: awake  Airway & Oxygen Therapy: Patient Spontanous Breathing  Post-op Assessment: Report given to RN and Post -op Vital signs reviewed and stable  Post vital signs: Reviewed and stable  Last Vitals:  Vitals Value Taken Time  BP 121/75 05/16/23 1059  Temp    Pulse 83 05/16/23 1059  Resp 15 05/16/23 1059  SpO2 96 % 05/16/23 1059  Vitals shown include unfiled device data.  Last Pain:  Vitals:   05/16/23 1059  TempSrc:   PainSc: 0-No pain         Complications: No notable events documented.

## 2023-05-19 ENCOUNTER — Encounter: Payer: Self-pay | Admitting: Gastroenterology

## 2023-05-19 LAB — SURGICAL PATHOLOGY
